# Patient Record
Sex: Female | Born: 1995 | Race: Black or African American | Hispanic: No | Marital: Single | Smoking: Never smoker
Health system: Southern US, Community
[De-identification: ages and names within clinical notes are randomized; demographics above are authoritative.]

## PROBLEM LIST (undated history)

## (undated) DIAGNOSIS — D332 Benign neoplasm of brain, unspecified: Secondary | ICD-10-CM

## (undated) DIAGNOSIS — E282 Polycystic ovarian syndrome: Secondary | ICD-10-CM

## (undated) DIAGNOSIS — R7303 Prediabetes: Secondary | ICD-10-CM

## (undated) HISTORY — DX: Benign neoplasm of brain, unspecified: D33.2

## (undated) HISTORY — DX: Polycystic ovarian syndrome: E28.2

## (undated) HISTORY — PX: BRAIN SURGERY: SHX531

## (undated) HISTORY — DX: Prediabetes: R73.03

---
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
## 2016-01-15 ENCOUNTER — Telehealth: Payer: Self-pay | Admitting: Family Medicine

## 2016-01-15 ENCOUNTER — Ambulatory Visit (INDEPENDENT_AMBULATORY_CARE_PROVIDER_SITE_OTHER): Payer: BC Managed Care – PPO | Admitting: Family Medicine

## 2016-01-15 ENCOUNTER — Encounter: Payer: Self-pay | Admitting: Family Medicine

## 2016-01-15 ENCOUNTER — Telehealth: Payer: Self-pay | Admitting: *Deleted

## 2016-01-15 VITALS — BP 122/62 | HR 75 | Temp 98.2°F | Ht 66.25 in | Wt 216.5 lb

## 2016-01-15 DIAGNOSIS — Z0000 Encounter for general adult medical examination without abnormal findings: Secondary | ICD-10-CM

## 2016-01-15 DIAGNOSIS — Z01419 Encounter for gynecological examination (general) (routine) without abnormal findings: Secondary | ICD-10-CM | POA: Insufficient documentation

## 2016-01-15 LAB — CBC WITH DIFFERENTIAL/PLATELET
BASOS ABS: 0 10*3/uL (ref 0.0–0.1)
Basophils Relative: 0.5 % (ref 0.0–3.0)
EOS ABS: 0.8 10*3/uL — AB (ref 0.0–0.7)
Eosinophils Relative: 11.8 % — ABNORMAL HIGH (ref 0.0–5.0)
HCT: 41.7 % (ref 36.0–49.0)
Hemoglobin: 13.7 g/dL (ref 12.0–16.0)
LYMPHS ABS: 2.1 10*3/uL (ref 0.7–4.0)
Lymphocytes Relative: 30.4 % (ref 24.0–48.0)
MCHC: 32.8 g/dL (ref 31.0–37.0)
MCV: 82.5 fl (ref 78.0–98.0)
MONOS PCT: 7.8 % (ref 3.0–12.0)
Monocytes Absolute: 0.5 10*3/uL (ref 0.1–1.0)
NEUTROS ABS: 3.5 10*3/uL (ref 1.4–7.7)
NEUTROS PCT: 49.5 % (ref 43.0–71.0)
PLATELETS: 296 10*3/uL (ref 150.0–575.0)
RBC: 5.05 Mil/uL (ref 3.80–5.70)
RDW: 13.5 % (ref 11.4–15.5)
WBC: 7 10*3/uL (ref 4.5–13.5)

## 2016-01-15 LAB — LIPID PANEL
CHOL/HDL RATIO: 4
Cholesterol: 206 mg/dL — ABNORMAL HIGH (ref 0–200)
HDL: 49.8 mg/dL (ref >39.00)
LDL CALC: 134 mg/dL — AB (ref 0–99)
NONHDL: 156.15
Triglycerides: 112 mg/dL (ref 0.0–149.0)
VLDL: 22.4 mg/dL (ref 0.0–40.0)

## 2016-01-15 LAB — COMPREHENSIVE METABOLIC PANEL
ALT: 13 U/L (ref 0–35)
AST: 15 U/L (ref 0–37)
Albumin: 4.2 g/dL (ref 3.5–5.2)
Alkaline Phosphatase: 50 U/L (ref 47–119)
BILIRUBIN TOTAL: 0.5 mg/dL (ref 0.2–1.2)
BUN: 9 mg/dL (ref 6–23)
CHLORIDE: 105 meq/L (ref 96–112)
CO2: 27 meq/L (ref 19–32)
Calcium: 9.7 mg/dL (ref 8.4–10.5)
Creatinine, Ser: 0.9 mg/dL (ref 0.40–1.20)
GFR: 103.17 mL/min (ref >60.00)
GLUCOSE: 94 mg/dL (ref 70–99)
Potassium: 4.4 mEq/L (ref 3.5–5.1)
Sodium: 139 mEq/L (ref 135–145)
Total Protein: 7.3 g/dL (ref 6.0–8.3)

## 2016-01-15 LAB — TSH: TSH: 2.55 u[IU]/mL (ref 0.40–5.00)

## 2016-01-15 NOTE — Progress Notes (Signed)
Pre visit review using our clinic review tool, if applicable. No additional management support is needed unless otherwise documented below in the visit note. 

## 2016-01-15 NOTE — Telephone Encounter (Signed)
Pt dropped off immunization form for school.  Place in rx tower. Please call [phone] when ready  Thanks

## 2016-01-15 NOTE — Progress Notes (Signed)
Subjective:   Patient ID: Shawna Robbins, female    DOB: [birth year], 20 y.o.   MRN: [id number]  Shawna Robbins is a pleasant 20 y.o. year old female who presents to clinic today with Annual Exam  on 01/15/2016  HPI: Doing well. Not sexually active.  She is excited about starting at [employer] in the fall.  She plans on going to pharmacy school.  Has no complaints today.   No results found for: CHOL, HDL, LDLCALC, LDLDIRECT, TRIG, CHOLHDL  No results found for: CREATININE  Current Outpatient Prescriptions on File Prior to Visit  Medication Sig Dispense Refill  . albuterol (PROVENTIL HFA;VENTOLIN HFA) 108 (90 Base) MCG/ACT inhaler Inhale 1-2 puffs into the lungs every 6 (six) hours as needed for wheezing or shortness of breath. 1 Inhaler 0  . montelukast (SINGULAIR) 10 MG tablet Take 1 tablet (10 mg total) by mouth at bedtime. 30 tablet 3   No current facility-administered medications on file prior to visit.     No Known Allergies  Past Medical History:  Diagnosis Date  . 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  Family History  Problem Relation Age of Onset  . Hypertension Maternal Grandmother     Social History   Social History  . Marital status: Single    Spouse name: N/A  . Number of children: N/A  . Years of education: N/A   Occupational History  . Not on file.   Social History Main Topics  . Smoking status: Never Smoker  . Smokeless tobacco: Never Used  . Alcohol use No  . Drug use: No  . Sexual activity: Not on file   Other Topics Concern  . 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   The PMH, PSH, Social History, Family History, Medications, and allergies have been reviewed in [facility], and have been updated if relevant.   Review of Systems  Constitutional: Negative.   HENT: Negative.   Eyes: Negative.   Respiratory: Negative.     Cardiovascular: Negative.   Gastrointestinal: Negative.   Endocrine: Negative.   Genitourinary: Negative.   Musculoskeletal: Negative.   Skin: Negative.   Allergic/Immunologic: Negative.   Neurological: Negative.   Hematological: Negative.   Psychiatric/Behavioral: Negative.   All other systems reviewed and are negative.      Objective:    BP 122/62   Pulse 75   Temp 98.2 F (36.8 C) (Oral)   Ht 5' 6.25" (1.683 m)   Wt 216 lb 8 oz (98.2 kg)   SpO2 98%   BMI 34.68 kg/m    Physical Exam  Constitutional: She is oriented to person, place, and time. She appears well-developed and well-nourished. No distress.  HENT:  Head: Normocephalic and atraumatic.  Eyes: Conjunctivae are normal.  Neck: Normal range of motion.  Cardiovascular: Normal rate and regular rhythm.   Pulmonary/Chest: Effort normal and breath sounds normal. No respiratory distress.  Abdominal: Soft.  Musculoskeletal: Normal range of motion. She exhibits no edema.  Neurological: She is alert and oriented to person, place, and time. No cranial nerve deficit.  Skin: Skin is warm and dry.  Psychiatric: She has a normal mood and affect. Her behavior is normal. Judgment and thought content normal.  Nursing note and vitals reviewed.         Assessment & Plan:  Well woman exam - Plan: CBC with Differential/Platelet, Comprehensive metabolic panel, Lipid panel, TSH, Urinalysis - Glucose/Protein No Follow-up on file.

## 2016-01-15 NOTE — Telephone Encounter (Signed)
PT brought in the Physical form to be filled out. Please call her when it is ready. Form placed in prescription tower.

## 2016-01-15 NOTE — Patient Instructions (Signed)
Great to see you! Good luck this fall  We will call you with your results from today and you can view them online.

## 2016-01-15 NOTE — Assessment & Plan Note (Signed)
Reviewed preventive care protocols, scheduled due services, and updated immunizations Discussed nutrition, exercise, diet, and healthy lifestyle.  Orders Placed This Encounter  Procedures  . CBC with Differential/Platelet  . Comprehensive metabolic panel  . Lipid panel  . TSH  . Urinalysis - Glucose/Protein

## 2016-01-16 ENCOUNTER — Encounter: Payer: Self-pay | Admitting: *Deleted

## 2016-01-16 ENCOUNTER — Other Ambulatory Visit: Payer: Self-pay | Admitting: Family Medicine

## 2016-01-16 ENCOUNTER — Ambulatory Visit (INDEPENDENT_AMBULATORY_CARE_PROVIDER_SITE_OTHER): Payer: BC Managed Care – PPO | Admitting: *Deleted

## 2016-01-16 DIAGNOSIS — Z0000 Encounter for general adult medical examination without abnormal findings: Secondary | ICD-10-CM | POA: Diagnosis not present

## 2016-01-16 DIAGNOSIS — Z111 Encounter for screening for respiratory tuberculosis: Secondary | ICD-10-CM

## 2016-01-16 NOTE — Telephone Encounter (Signed)
Form given to [facility] for completion, following reading of PPD

## 2016-01-18 LAB — URINALYSIS, ROUTINE W REFLEX MICROSCOPIC
Bilirubin Urine: NEGATIVE
HGB URINE DIPSTICK: NEGATIVE
Ketones, ur: NEGATIVE
Nitrite: NEGATIVE
Specific Gravity, Urine: 1.02 (ref 1.000–1.030)
TOTAL PROTEIN, URINE-UPE24: NEGATIVE
UROBILINOGEN UA: 0.2 (ref 0.0–1.0)
Urine Glucose: NEGATIVE
pH: 6.5 (ref 5.0–8.0)

## 2016-01-18 LAB — TB SKIN TEST: TB SKIN TEST: NEGATIVE

## 2016-01-18 NOTE — Addendum Note (Signed)
Addended by: Ellamae Sia on: 01/18/2016 12:09 PM   Modules accepted: Orders

## 2016-01-18 NOTE — Telephone Encounter (Signed)
Pt came in for TB skin test read and brought urine specimen;Terry looked in orders and sent to lab. Pt will call back on Mon to verify form is ready for pick up. Forms given to [place].

## 2016-01-23 NOTE — Telephone Encounter (Signed)
Patient called to find out if her form is ready for pick up.  Please call her back at [phone].

## 2016-01-24 NOTE — Telephone Encounter (Signed)
Lm on pts vm and informed her paperwork is available for pickup at the front desk

## 2016-01-28 ENCOUNTER — Other Ambulatory Visit: Payer: Self-pay | Admitting: Family Medicine

## 2016-07-14 DIAGNOSIS — E221 Hyperprolactinemia: Secondary | ICD-10-CM | POA: Insufficient documentation

## 2016-07-15 ENCOUNTER — Other Ambulatory Visit: Payer: Self-pay | Admitting: Obstetrics and Gynecology

## 2016-07-15 DIAGNOSIS — E221 Hyperprolactinemia: Secondary | ICD-10-CM

## 2016-07-28 ENCOUNTER — Ambulatory Visit
Admission: RE | Admit: 2016-07-28 | Discharge: 2016-07-28 | Disposition: A | Discharge status: Home / Self Care | Payer: BC Managed Care – PPO | Source: Ambulatory Visit | Attending: Obstetrics and Gynecology | Admitting: Obstetrics and Gynecology

## 2016-07-28 DIAGNOSIS — E221 Hyperprolactinemia: Secondary | ICD-10-CM

## 2016-07-28 MED ORDER — GADOBENATE DIMEGLUMINE 529 MG/ML IV SOLN
10.0000 mL | Freq: Once | INTRAVENOUS | Status: AC | PRN
  Administered 2016-07-28: 10 mL via INTRAVENOUS

## 2016-08-11 DIAGNOSIS — E282 Polycystic ovarian syndrome: Secondary | ICD-10-CM | POA: Insufficient documentation

## 2016-08-11 DIAGNOSIS — D352 Benign neoplasm of pituitary gland: Secondary | ICD-10-CM | POA: Insufficient documentation

## 2016-08-17 DIAGNOSIS — N911 Secondary amenorrhea: Secondary | ICD-10-CM | POA: Insufficient documentation

## 2016-09-15 ENCOUNTER — Ambulatory Visit (INDEPENDENT_AMBULATORY_CARE_PROVIDER_SITE_OTHER): Payer: BC Managed Care – PPO | Admitting: Family Medicine

## 2016-09-15 ENCOUNTER — Encounter (INDEPENDENT_AMBULATORY_CARE_PROVIDER_SITE_OTHER): Payer: Self-pay

## 2016-09-15 DIAGNOSIS — E237 Disorder of pituitary gland, unspecified: Secondary | ICD-10-CM | POA: Diagnosis not present

## 2016-09-15 NOTE — Progress Notes (Signed)
Subjective:   Patient ID: Shawna Robbins, female    DOB: 08-21-1995, 21 y.o.   MRN: [id number]  Shawna Robbins is a pleasant 21 y.o. year old female who presents to clinic today with Follow-up (Endocrinologist)  on 09/15/2016  HPI:  Here to discuss what has been happening with her- she was diagnosed with a pituitary tumor in 07/2016.  Was seeing her gynecologist for amenorrhea and discovered that her prolactin level was elevated.  Since she has a history of a benign brain tumor resected in 2011, she was referred again to neurosurgery at [facility].  Notes in Epic reviewed  Neurosurgery updated the following plan of care on 08/26/16:   1. We defer to Endocrinology for medication treatment of pituitary lesion (who has ordered Cortisol testing to evaluate pituitary and tested prolactin which was abnormal) 2. We are adding patient to tumor board to discuss treatment plan (this message was sent to scheduling, (this has not been scheduled yet) 3. We would like 6 month follow up with MRI and same day appt with Dr Tivis Ringer 4. Recommend opthalmology evaluation with field cut exam (If she needs a referral to opthalmology we can provide this )  Now she has done cheek swabs and 24 hour urine tests for endo and awaiting the next step- endocrinologist is Dr. Ladonna Snide at [facility].  Current Outpatient Prescriptions on File Prior to Visit  Medication Sig Dispense Refill  . montelukast (SINGULAIR) 10 MG tablet Take 1 tablet (10 mg total) by mouth at bedtime. 30 tablet 3  . PROAIR HFA 108 (90 Base) MCG/ACT inhaler INHALE 1-2 PUFFS INTO THE LUNGS EVERY 6 (SIX) HOURS AS NEEDED FOR WHEEZING OR SHORTNESS OF BREATH. 8.5 Inhaler 0   No current facility-administered medications on file prior to visit.     No Known Allergies  Past Medical History:  Diagnosis Date  . 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  Family History  Problem Relation Age of Onset  .  Hypertension Maternal Grandmother     Social History   Social History  . Marital status: Single    Spouse name: N/A  . Number of children: N/A  . Years of education: N/A   Occupational History  . Not on file.   Social History Main Topics  . Smoking status: Never Smoker  . Smokeless tobacco: Never Used  . Alcohol use No  . Drug use: No  . Sexual activity: Not on file   Other Topics Concern  . Not on file   Social History Narrative   Transferring to [employer] this fall- 21   The PMH, PSH, Social History, Family History, Medications, and allergies have been reviewed in [facility], and have been updated if relevant.   Review of Systems  Constitutional: Positive for unexpected weight change. Negative for fatigue.  Eyes: Negative.   Neurological: Negative.   All other systems reviewed and are negative.      Objective:    BP 118/70   Pulse 98   Wt 231 lb 8 oz (105 kg)   SpO2 99%   BMI 37.08 kg/m    Physical Exam  Constitutional: She is oriented to person, place, and time. She appears well-developed and well-nourished. No distress.  HENT:  Head: Normocephalic and atraumatic.  Eyes: Conjunctivae are normal.  Cardiovascular: Normal rate.   Pulmonary/Chest: Effort normal.  Musculoskeletal: Normal range of motion. She exhibits no edema.  Neurological:  She is alert and oriented to person, place, and time. No cranial nerve deficit.  Skin: Skin is warm and dry. She is not diaphoretic.  Psychiatric: She has a normal mood and affect. Her behavior is normal. Judgment and thought content normal.  Nursing note and vitals reviewed.         Assessment & Plan:   Lesion of pituitary gland (HCC) No Follow-up on file.

## 2016-09-15 NOTE — Assessment & Plan Note (Signed)
>  25 minutes spent in face to face time with patient, >50% spent in counselling or coordination of care I will request more records and labs from Dr. Ladonna Snide.

## 2016-11-27 DIAGNOSIS — L700 Acne vulgaris: Secondary | ICD-10-CM | POA: Insufficient documentation

## 2017-03-15 ENCOUNTER — Emergency Department (HOSPITAL_COMMUNITY)
Admission: EM | Admit: 2017-03-15 | Discharge: 2017-03-15 | Disposition: A | Discharge status: Home / Self Care | Payer: BC Managed Care – PPO | Attending: Emergency Medicine | Admitting: Emergency Medicine

## 2017-03-15 ENCOUNTER — Encounter (HOSPITAL_COMMUNITY): Payer: Self-pay | Admitting: Emergency Medicine

## 2017-03-15 ENCOUNTER — Emergency Department (HOSPITAL_COMMUNITY): Payer: BC Managed Care – PPO

## 2017-03-15 DIAGNOSIS — N200 Calculus of kidney: Secondary | ICD-10-CM | POA: Insufficient documentation

## 2017-03-15 DIAGNOSIS — Z79899 Other long term (current) drug therapy: Secondary | ICD-10-CM | POA: Diagnosis not present

## 2017-03-15 DIAGNOSIS — R1031 Right lower quadrant pain: Secondary | ICD-10-CM | POA: Diagnosis present

## 2017-03-15 LAB — COMPREHENSIVE METABOLIC PANEL
ALBUMIN: 3.9 g/dL (ref 3.5–5.0)
ALT: 36 U/L (ref 14–54)
AST: 54 U/L — AB (ref 15–41)
Alkaline Phosphatase: 50 U/L (ref 38–126)
Anion gap: 10 (ref 5–15)
CHLORIDE: 105 mmol/L (ref 101–111)
CO2: 23 mmol/L (ref 22–32)
CREATININE: 1.18 mg/dL — AB (ref 0.44–1.00)
Calcium: 9.2 mg/dL (ref 8.9–10.3)
GFR calc Af Amer: >60 mL/min (ref >60)
GLUCOSE: 113 mg/dL — AB (ref 65–99)
Potassium: 3.6 mmol/L (ref 3.5–5.1)
SODIUM: 138 mmol/L (ref 135–145)
Total Bilirubin: 0.7 mg/dL (ref 0.3–1.2)
Total Protein: 7.1 g/dL (ref 6.5–8.1)

## 2017-03-15 LAB — URINALYSIS, ROUTINE W REFLEX MICROSCOPIC
BILIRUBIN URINE: NEGATIVE
GLUCOSE, UA: NEGATIVE mg/dL
Ketones, ur: NEGATIVE mg/dL
Nitrite: NEGATIVE
PH: 6 (ref 5.0–8.0)
Protein, ur: NEGATIVE mg/dL
SPECIFIC GRAVITY, URINE: 1.021 (ref 1.005–1.030)

## 2017-03-15 LAB — CBC
HCT: 41.6 % (ref 36.0–46.0)
Hemoglobin: 13.1 g/dL (ref 12.0–15.0)
MCH: 27.1 pg (ref 26.0–34.0)
MCHC: 31.5 g/dL (ref 30.0–36.0)
MCV: 86 fL (ref 78.0–100.0)
PLATELETS: 285 10*3/uL (ref 150–400)
RBC: 4.84 MIL/uL (ref 3.87–5.11)
RDW: 13.8 % (ref 11.5–15.5)
WBC: 6.8 10*3/uL (ref 4.0–10.5)

## 2017-03-15 LAB — I-STAT BETA HCG BLOOD, ED (MC, WL, AP ONLY): I-stat hCG, quantitative: <5 m[IU]/mL (ref <5)

## 2017-03-15 LAB — LIPASE, BLOOD: LIPASE: 29 U/L (ref 11–51)

## 2017-03-15 MED ORDER — ONDANSETRON HCL 4 MG/2ML IJ SOLN
4.0000 mg | Freq: Once | INTRAMUSCULAR | Status: AC
Start: 2017-03-15 — End: 2017-03-15
  Administered 2017-03-15: 4 mg via INTRAVENOUS
  Filled 2017-03-15: qty 2

## 2017-03-15 MED ORDER — ONDANSETRON HCL 4 MG PO TABS: 4.0000 mg | ORAL_TABLET | Freq: Four times a day (QID) | ORAL | 0 refills | Status: DC

## 2017-03-15 MED ORDER — HYDROCODONE-ACETAMINOPHEN 5-325 MG PO TABS: 1.0000 | ORAL_TABLET | ORAL | 0 refills | Status: DC | PRN

## 2017-03-15 MED ORDER — TAMSULOSIN HCL 0.4 MG PO CAPS: 0.4000 mg | ORAL_CAPSULE | Freq: Every day | ORAL | 0 refills | Status: DC

## 2017-03-15 MED ORDER — KETOROLAC TROMETHAMINE 30 MG/ML IJ SOLN
30.0000 mg | Freq: Once | INTRAMUSCULAR | Status: AC
Start: 2017-03-15 — End: 2017-03-15
  Administered 2017-03-15: 30 mg via INTRAVENOUS
  Filled 2017-03-15: qty 1

## 2017-03-15 MED ORDER — HYDROMORPHONE HCL 1 MG/ML IJ SOLN
1.0000 mg | Freq: Once | INTRAMUSCULAR | Status: AC
  Administered 2017-03-15: 1 mg via INTRAVENOUS
  Filled 2017-03-15: qty 1

## 2017-03-15 MED ORDER — SODIUM CHLORIDE 0.9 % IV BOLUS (SEPSIS)
1000.0000 mL | Freq: Once | INTRAVENOUS | Status: AC
  Administered 2017-03-15: 1000 mL via INTRAVENOUS

## 2017-03-15 NOTE — ED Triage Notes (Signed)
Pt c/o right flank pain that began at midnight tonight. C/o nausea, denies vomiting/diarrhea. Denies urinary symptoms/vaginal discharge.

## 2017-03-15 NOTE — ED Provider Notes (Signed)
Pt reports she is pain free.  Pt is able to tolerate fluids without vomiting Meds ordered this encounter  Medications  . cabergoline (DOSTINEX) 0.5 MG tablet    Sig: Take 0.5 mg by mouth 2 (two) times a week.  . sodium chloride 0.9 % bolus 1,000 mL  . ketorolac (TORADOL) 30 MG/ML injection 30 mg  . HYDROmorphone (DILAUDID) injection 1 mg  . ondansetron (ZOFRAN) injection 4 mg  . ondansetron (ZOFRAN) 4 MG tablet    Sig: Take 1 tablet (4 mg total) by mouth every 6 (six) hours.    Dispense:  12 tablet    Refill:  0    Order Specific Question:   Supervising Provider    Answer:   MILLER, BRIAN [[id number]]  . HYDROcodone-acetaminophen (NORCO/VICODIN) 5-325 MG tablet    Sig: Take 1-2 tablets by mouth every 4 (four) hours as needed.    Dispense:  8 tablet    Refill:  0    Order Specific Question:   Supervising Provider    Answer:   MILLER, BRIAN [[id number]]  . tamsulosin (FLOMAX) 0.4 MG CAPS capsule    Sig: Take 1 capsule (0.4 mg total) by mouth daily.    Dispense:  30 capsule    Refill:  0    Order Specific Question:   Supervising Provider    Answer:   MILLER, BRIAN [[id number]]   .  An After Visit Summary was printed and given to the patient.    Fransico Meadow, PA-C 03/15/17 0930    Noemi Chapel, MD 03/17/17 2020

## 2017-03-15 NOTE — Discharge Instructions (Addendum)
You can take Tylenol or Ibuprofen as directed for pain. You can alternate Tylenol and Ibuprofen every 4 hours. You can take pain medication for severe or break through pain.   Make sure you are staying hydrated and drinking plenty of fluids.   Use the strainer to strain your urine.  Take Flomax as directed.   Follow-up with referred Urology for any worsening or concerning symptoms.   Return to the ED for any worsening pain, fever, persistent vomiting, pain with urination or any other worsening or concerning symptoms.

## 2017-03-15 NOTE — ED Provider Notes (Signed)
[facility] DEPT Provider Note   CSN: [id number] Arrival date & time: 03/15/17  0231     History   Chief Complaint Chief Complaint  Patient presents with  . Flank Pain    HPI Shawna Robbins is a 21 y.o. female who presents with right side pain that began at approximately 12 AM. Patient stats that pain has been constant since onset. She states that the pain originates to the right side and radiates to the RLQ. She denies any alleviating or admitting factors. She states that pain is not affected by movement or laying supine.She states that she has not taken any medications for symptoms. Patient reports associated nausea but denies any vomiting. Patient states that she was in her normal state of health yesterday prior to onset of symptoms.  The history is provided by the patient.    Past Medical History:  Diagnosis Date  . Brain tumor (benign) ([place]) 2011   s/p resection at [facility]    Patient Active Problem List   Diagnosis Date Noted  . 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 Abnormal menstrual periods 05/24/2015    Past Surgical History:  Procedure Laterality Date  . BRAIN SURGERY      OB History    No data available       Home Medications    Prior to Admission medications   Medication Sig Start Date End Date Taking? Authorizing Provider  cabergoline (DOSTINEX) 0.5 MG tablet Take 0.5 mg by mouth 2 (two) times a week. 03/09/17  Yes [provider]  HYDROcodone-acetaminophen (NORCO/VICODIN) 5-325 MG tablet Take 1-2 tablets by mouth every 4 (four) hours as needed. 03/15/17   Volanda Napoleon, PA-C  montelukast (SINGULAIR) 10 MG tablet Take 1 tablet (10 mg total) by mouth at bedtime. Patient not taking: Reported on 03/15/2017 12/05/15   Lucille Passy, MD  ondansetron (ZOFRAN) 4 MG tablet Take 1 tablet (4 mg total) by mouth every 6 (six) hours. 03/15/17   Volanda Napoleon, PA-C  PROAIR HFA 108 (90 Base) MCG/ACT inhaler INHALE 1-2 PUFFS INTO THE LUNGS EVERY 6  (SIX) HOURS AS NEEDED FOR WHEEZING OR SHORTNESS OF BREATH. Patient not taking: Reported on 03/15/2017 01/29/16   Lucille Passy, MD  tamsulosin (FLOMAX) 0.4 MG CAPS capsule Take 1 capsule (0.4 mg total) by mouth daily. 03/15/17   Volanda Napoleon, PA-C    Family History Family History  Problem Relation Age of Onset  . Hypertension Maternal Grandmother     Social History Social History  Substance Use Topics  . Smoking status: Never Smoker  . Smokeless tobacco: Never Used  . Alcohol use No     Allergies   Patient has no known allergies.   Review of Systems Review of Systems  Constitutional: Negative for chills and fever.  Respiratory: Negative for shortness of breath.   Cardiovascular: Negative for chest pain.  Gastrointestinal: Positive for abdominal pain and nausea. Negative for blood in stool, diarrhea and vomiting.  Genitourinary: Positive for flank pain. Negative for dysuria, hematuria and vaginal bleeding.  Neurological: Negative for weakness and numbness.     Physical Exam Updated Vital Signs BP (!) 94/51 (BP Location: Left Arm)   Pulse 79   Temp 98.2 F (36.8 C) (Oral)   Resp 16   Ht 5\' 7"  (1.702 m)   Wt 106.1 kg (234 lb)   SpO2 99%   BMI 36.65 kg/m   Physical Exam  Constitutional: She is oriented to person, place, and  time. She appears well-developed and well-nourished.  Appears uncomfortable but no acute distress   HENT:  Head: Normocephalic and atraumatic.  Mouth/Throat: Oropharynx is clear and moist and mucous membranes are normal.  Eyes: Pupils are equal, round, and reactive to light. Conjunctivae, EOM and lids are normal.  Neck: Full passive range of motion without pain.  Cardiovascular: Normal rate, regular rhythm, normal heart sounds and normal pulses.  Exam reveals no gallop and no friction rub.   No murmur heard. Pulmonary/Chest: Effort normal and breath sounds normal.  Abdominal: Soft. Normal appearance. There is tenderness in the right lower  quadrant. There is no rigidity, no guarding and no CVA tenderness.  Abdomen is soft, non-distended. She has tenderness to palpation that begins at the lateral right side of the abdomen and extends down into the RLQ.   Musculoskeletal: Normal range of motion.  Neurological: She is alert and oriented to person, place, and time.  Skin: Skin is warm and dry. Capillary refill takes less than 2 seconds.  Psychiatric: She has a normal mood and affect. Her speech is normal.  Nursing note and vitals reviewed.    ED Treatments / Results  Labs (all labs ordered are listed, but only abnormal results are displayed) Labs Reviewed  COMPREHENSIVE METABOLIC PANEL - Abnormal; Notable for the following:       Result Value   Glucose, Bld 113 (*)    BUN <5 (*)    Creatinine, Ser 1.18 (*)    AST 54 (*)    All other components within normal limits  URINALYSIS, ROUTINE W REFLEX MICROSCOPIC - Abnormal; Notable for the following:    APPearance HAZY (*)    Hgb urine dipstick LARGE (*)    Leukocytes, UA TRACE (*)    Bacteria, UA RARE (*)    Squamous Epithelial / LPF 0-5 (*)    All other components within normal limits  LIPASE, BLOOD  CBC  I-STAT BETA HCG BLOOD, ED ([facility], [facility], [facility] ONLY)    EKG  EKG Interpretation None       Radiology Ct Renal Stone Study  Result Date: 03/15/2017 CLINICAL DATA:  Sudden onset right flank pain starting about midnight. EXAM: CT ABDOMEN AND PELVIS WITHOUT CONTRAST TECHNIQUE: Multidetector CT imaging of the abdomen and pelvis was performed following the standard protocol without IV contrast. COMPARISON:  None. FINDINGS: Lower chest: Mild dependent changes in the lung bases. Hepatobiliary: No focal liver abnormality is seen. No gallstones, gallbladder wall thickening, or biliary dilatation. Pancreas: Unremarkable. No pancreatic ductal dilatation or surrounding inflammatory changes. Spleen: Normal in size without focal abnormality. Adrenals/Urinary Tract: There is a punctate size  stone measuring 2 mm in the distal right ureter at the ureterovesical junction. There is mild proximal hydronephrosis and hydroureter with stranding around the right kidney. No additional stones identified. No hydronephrosis or hydroureter on the left. Bladder is decompressed. Stomach/Bowel: Stomach is within normal limits. Appendix appears normal. No evidence of bowel wall thickening, distention, or inflammatory changes. Vascular/Lymphatic: No significant vascular findings are present. No enlarged abdominal or pelvic lymph nodes. Reproductive: Uterus and bilateral adnexa are unremarkable. Other: No abdominal wall hernia or abnormality. No abdominopelvic ascites. Musculoskeletal: No acute or significant osseous findings. IMPRESSION: 2 mm stone in the distal right ureter with moderate proximal obstruction. Electronically Signed   By: Lucienne Capers M.D.   On: 03/15/2017 06:52    Procedures Procedures (including critical care time)  Medications Ordered in ED Medications  sodium chloride 0.9 % bolus 1,000 mL (0  mLs Intravenous Stopped 03/15/17 0737)  ketorolac (TORADOL) 30 MG/ML injection 30 mg (30 mg Intravenous Given 03/15/17 0714)  HYDROmorphone (DILAUDID) injection 1 mg (1 mg Intravenous Given 03/15/17 0730)  ondansetron (ZOFRAN) injection 4 mg (4 mg Intravenous Given 03/15/17 0731)     Initial Impression / Assessment and Plan / ED Course  I have reviewed the triage vital signs and the nursing notes.  Pertinent labs & imaging results that were available during my care of the patient were reviewed by me and considered in my medical decision making (see chart for details).     21 y.o. F who presents with right lateral abdomen pain that began last night at 12 am. Associated with nausea. No vomiting, fevers, Patient is afebrile, non-toxic appearing, sitting comfortably on examination table. Vital signs reviewed and stable. Physical exam shows tenderness to the right lateral abdomen that extends into  the right lower quadrant. Consider kidney stone vs UTI appendicitis. History/physical exam are not concerning for ovarian torsion. Initial labs ordered at triage including beta hCG, lipase, CBC, UA, CMP. IVF given for fluid resuscitation. Analgesics provided in the department.  Labs reviewed. Beta-hCG is negative. Lipase unremarkable. CBC unremarkable. CMP shows slight bump in creatinine at 1.18. Urine shows large hemoglobin, trace leukocytes. Discussed results with dad and patient. Patient states that she is not currently on her menstrual cycle. Concern for kidney stone. Will obtain CT renal stone scan for further evaluation.  CT renal stone study reviewed. There is a 2 mm stone in the distal right ureter with moderate proximal obstruction. Appendix appears normal.  Discussed results with patient and dad. Patient reports slight improvement in pain after medication. Patient is still having some tenderness in the right lateral abdomen and right side. Patient reporting some nausea. Plan for additional pain medications and anti-emetics.  Patient signed out to Alyse Low, PA-C with additional pain meds pending. If patient has improvement in pain and able to tolerate PO, plan for discharge home.    Final Clinical Impressions(s) / ED Diagnoses   Final diagnoses:  Kidney stone    New Prescriptions Discharge Medication List as of 03/15/2017  9:27 AM    START taking these medications   Details  HYDROcodone-acetaminophen (NORCO/VICODIN) 5-325 MG tablet Take 1-2 tablets by mouth every 4 (four) hours as needed., Starting Sun 03/15/2017, Print    ondansetron (ZOFRAN) 4 MG tablet Take 1 tablet (4 mg total) by mouth every 6 (six) hours., Starting Sun 03/15/2017, Print    tamsulosin (FLOMAX) 0.4 MG CAPS capsule Take 1 capsule (0.4 mg total) by mouth daily., Starting Sun 03/15/2017, Print         Volanda Napoleon, PA-C 32/99/24 2683    Delora Fuel, MD 41/96/22 [phone]

## 2018-11-04 ENCOUNTER — Telehealth: Payer: Self-pay | Admitting: Family Medicine

## 2018-11-04 NOTE — Telephone Encounter (Signed)
Called to see if pt still seeing Dr Deborra Medina as PCP since its been since 2018 when she was last seen, left message

## 2019-01-13 ENCOUNTER — Other Ambulatory Visit: Payer: Self-pay

## 2019-01-13 ENCOUNTER — Ambulatory Visit (INDEPENDENT_AMBULATORY_CARE_PROVIDER_SITE_OTHER): Payer: BC Managed Care – PPO | Admitting: Nurse Practitioner

## 2019-01-13 ENCOUNTER — Encounter: Payer: Self-pay | Admitting: Nurse Practitioner

## 2019-01-13 VITALS — Temp 96.7°F | Ht 67.0 in | Wt 240.0 lb

## 2019-01-13 DIAGNOSIS — J069 Acute upper respiratory infection, unspecified: Secondary | ICD-10-CM

## 2019-01-13 MED ORDER — SALINE SPRAY 0.65 % NA SOLN: 1.0000 | NASAL | 0 refills | Status: DC | PRN

## 2019-01-13 MED ORDER — BENZONATATE 100 MG PO CAPS: 100.0000 mg | ORAL_CAPSULE | Freq: Three times a day (TID) | ORAL | 0 refills | Status: DC | PRN

## 2019-01-13 MED ORDER — GUAIFENESIN ER 600 MG PO TB12: 600.0000 mg | ORAL_TABLET | Freq: Two times a day (BID) | ORAL | 0 refills | Status: DC | PRN

## 2019-01-13 MED ORDER — CHLORPHEN-PE-ACETAMINOPHEN 4-10-325 MG PO TABS: 1.0000 | ORAL_TABLET | Freq: Three times a day (TID) | ORAL | 0 refills | Status: AC

## 2019-01-13 NOTE — Progress Notes (Signed)
Virtual Visit via Video Note  I connected with Shawna Robbins on 01/13/19 at  2:00 PM EDT by a video enabled telemedicine application and verified that I am speaking with the correct person using two identifiers.  Location: Patient: Home Provider: Office   I discussed the limitations of evaluation and management by telemedicine and the availability of in person appointments. The patient expressed understanding and agreed to proceed.  CC: pt is c/o chest congestion,coughing at times,headache,scratchy throat/4 days/took mucinex and alka seltzer  History of Present Illness: URI  This is a new problem. The current episode started in the past 7 days. The problem has been gradually worsening. The maximum temperature recorded prior to her arrival was 100.4 - 100.9 F. Associated symptoms include congestion, coughing, headaches, joint pain, rhinorrhea, sinus pain, sneezing and a sore throat. Pertinent negatives include no chest pain, diarrhea, dysuria, ear pain, joint swelling, nausea, neck pain, plugged ear sensation, rash, swollen glands or wheezing. She has tried decongestant and acetaminophen for the symptoms. The treatment provided mild relief.  fever of 100 last night, no fever today  contact with sick niece (nasal congestion and cough) on Saturday, onset of symptoms the next day. Niece's symptoms have now resolve. Her Brother traveled to [place] 2weeks ago, he is asymptomatic. Mother ordered Home test kit from [facility] and she has kit at home now. She plans to collect and return sample today.  Observations/Objective: Physical Exam  Constitutional: She is oriented to person, place, and time. No distress.  Eyes: Pupils are equal, round, and reactive to light. EOM are normal.  Pulmonary/Chest: Effort normal.  Neurological: She is alert and oriented to person, place, and time.  Voice indicates nasal congestion  Skin: She is not diaphoretic.  Psychiatric: She has a normal mood and affect. Her  behavior is normal. Thought content normal.  Vitals reviewed.  Assessment and Plan: Shawna Robbins was seen today for nasal congestion.  Diagnoses and all orders for this visit:  Upper respiratory tract infection, unspecified type -     guaiFENesin (MUCINEX) 600 MG 12 hr tablet; Take 1 tablet (600 mg total) by mouth 2 (two) times daily as needed for cough or to loosen phlegm. -     Chlorphen-PE-Acetaminophen 4-10-325 MG TABS; Take 1 tablet by mouth every 8 (eight) hours for 3 days. -     benzonatate (TESSALON) 100 MG capsule; Take 1 capsule (100 mg total) by mouth 3 (three) times daily as needed for cough. -     sodium chloride (OCEAN) 0.65 % SOLN nasal spray; Place 1 spray into both nostrils as needed for congestion.   Follow Up Instructions: See avs   I discussed the assessment and treatment plan with the patient. The patient was provided an opportunity to ask questions and all were answered. The patient agreed with the plan and demonstrated an understanding of the instructions.   The patient was advised to call back or seek an in-person evaluation if the symptoms worsen or if the condition fails to improve as anticipated.   Wilfred Lacy, NP

## 2019-01-13 NOTE — Patient Instructions (Addendum)
She opted to use Home test kit with self swab. Submit Home test kit to [facility] through [employer] as directed, once nasal swab is collected. Call office with results or send through mychart.  Start zyrtec/claritin/allegra 1tab daily after completion of norel AD tabs. Use saline sinus rinse once a day. May also use humidifier at bedtime as needed. Avoid use oof nasal decongestant for more than 3days.  Follow home isolation guidelines till you get COVID results.  Maintain adequate oral hydration.  This information is directly available on the CDC website: RunningShows.co.za.html    Source:CDC Reference to specific commercial products, manufacturers, companies, or trademarks does not constitute its endorsement or recommendation by the [place], [place], or Centers for [employer] and Prevention.

## 2019-01-29 IMAGING — CT CT RENAL STONE PROTOCOL
2 of 4 series · 15 of 46 positions shown, 17 images · non-contrast
Comparison: None.

CLINICAL DATA: Sudden onset right flank pain starting about
midnight.

EXAM:
CT ABDOMEN AND PELVIS WITHOUT CONTRAST
TECHNIQUE: Multidetector CT imaging of the abdomen and pelvis was performed
following the standard protocol without IV contrast.

[Series 3: ap without · axial · non-contrast · 0.67mm/px · z∈[-981,-531]mm · 12 of 102 slices shown, 14 images]
[im 6/102  soft-tissue]
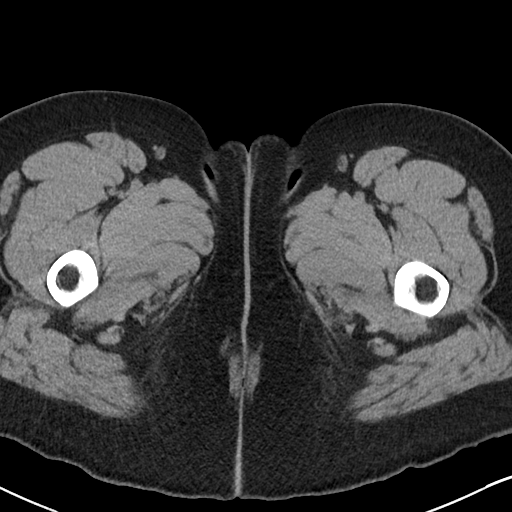
[im 6/102  bone]
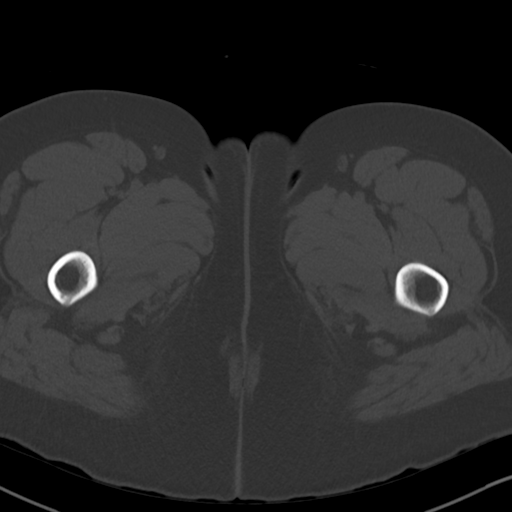
[im 16/102  soft-tissue]
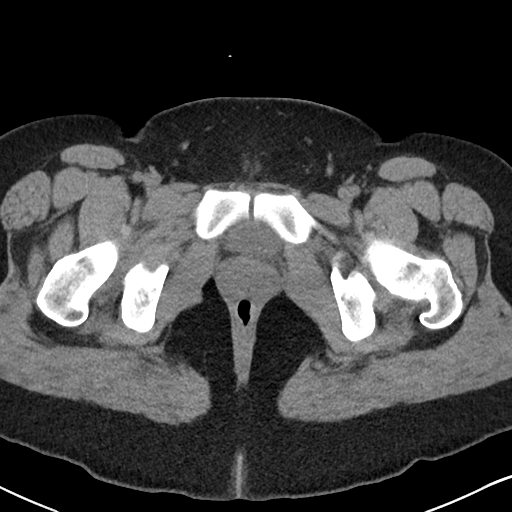
[im 22/102  soft-tissue]
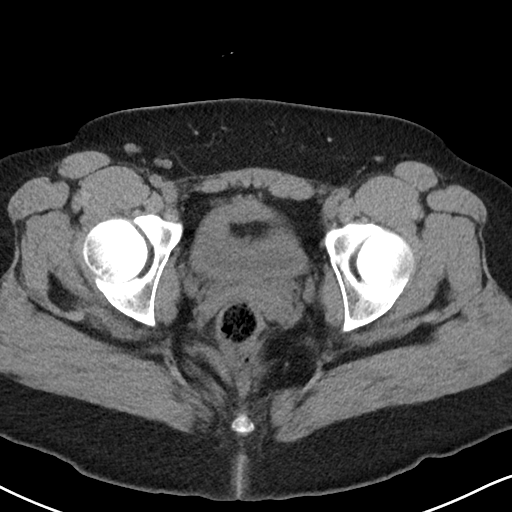
[im 32/102  soft-tissue]
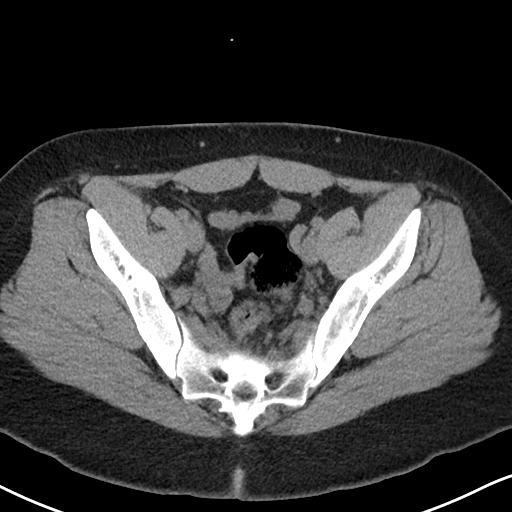
[im 38/102  soft-tissue]
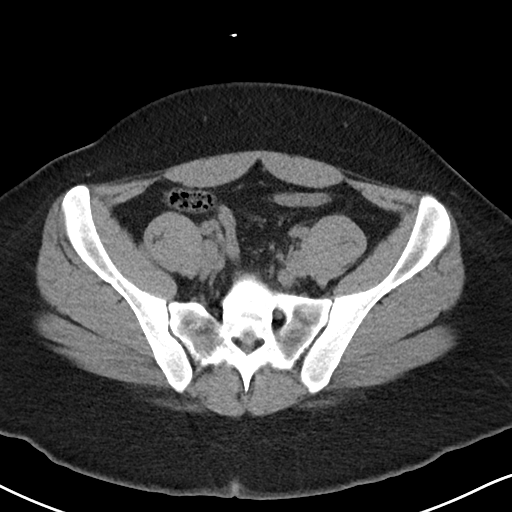
[im 48/102  soft-tissue]
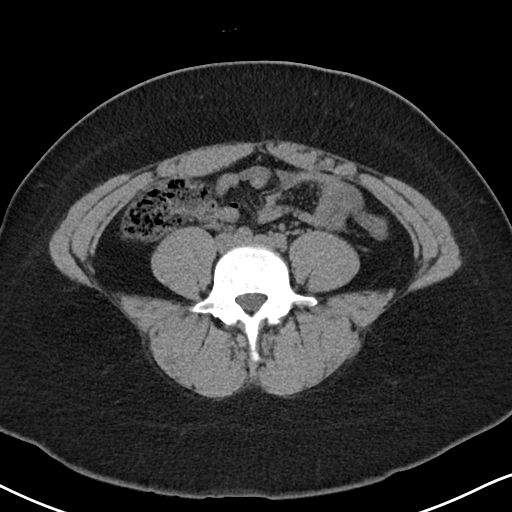
[im 54/102  soft-tissue]
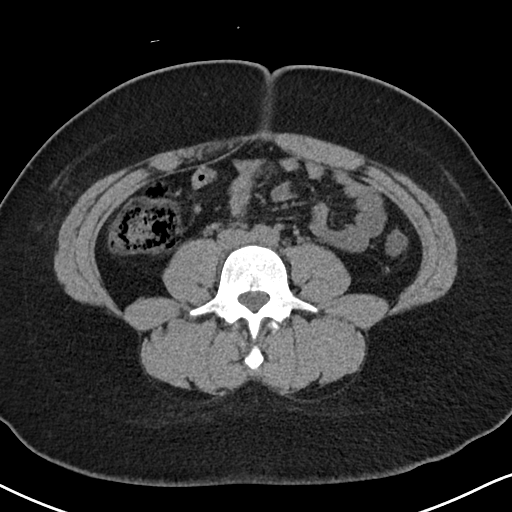
[im 64/102  soft-tissue]
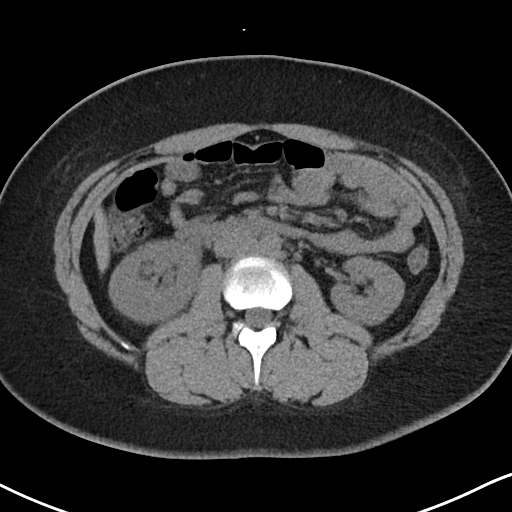
[im 70/102  soft-tissue]
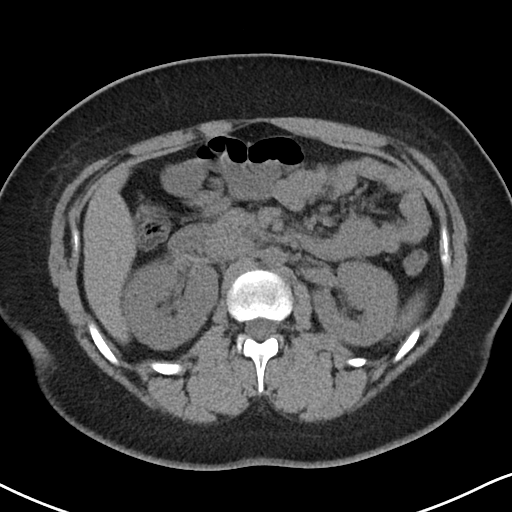
[im 70/102  bone]
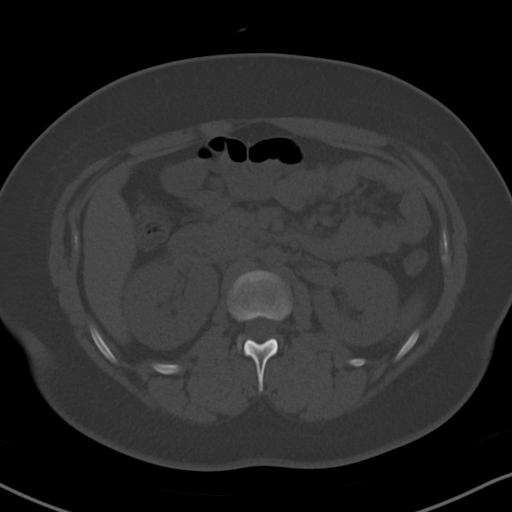
[im 80/102  soft-tissue]
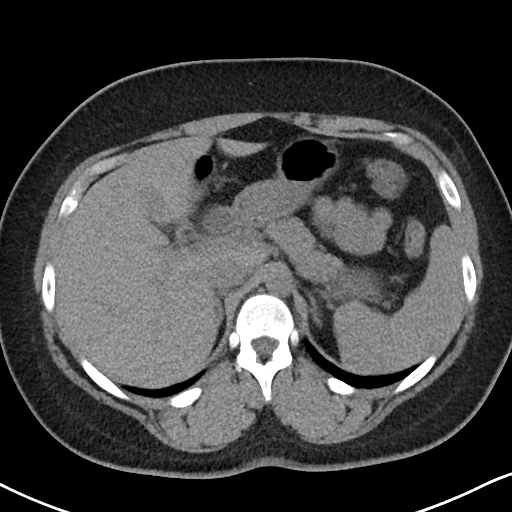
[im 86/102  soft-tissue]
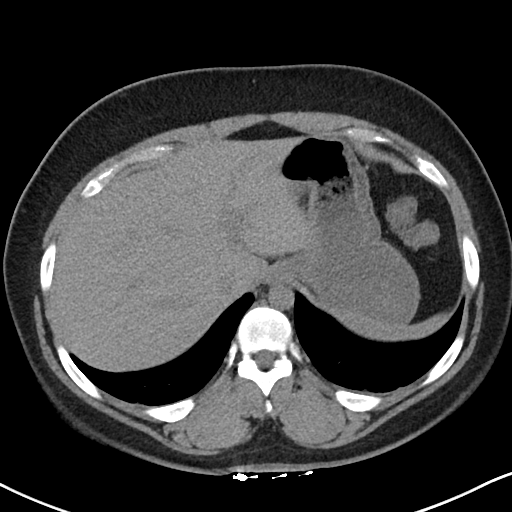
[im 96/102  soft-tissue]
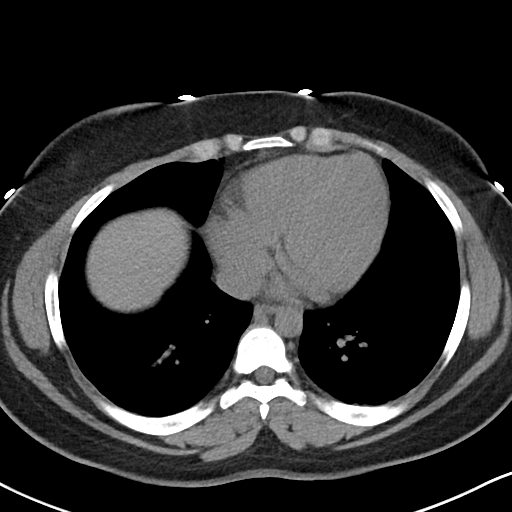

[Series 6: cor · coronal · 0.62mm/px · 3 of 79 slices shown]
[im 27/79  soft-tissue]
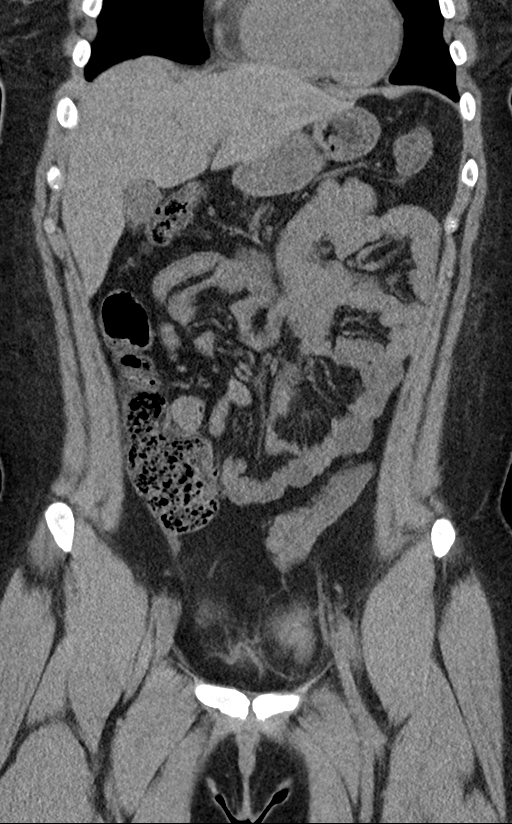
[im 35/79  soft-tissue]
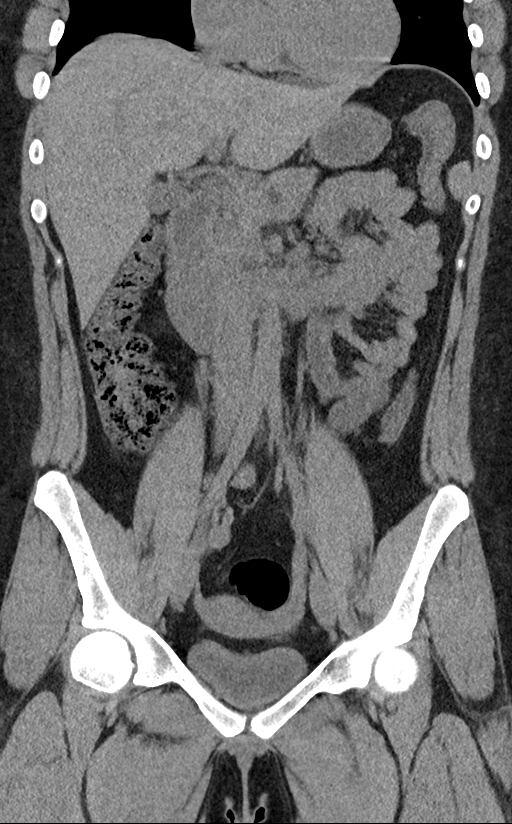
[im 44/79  soft-tissue]
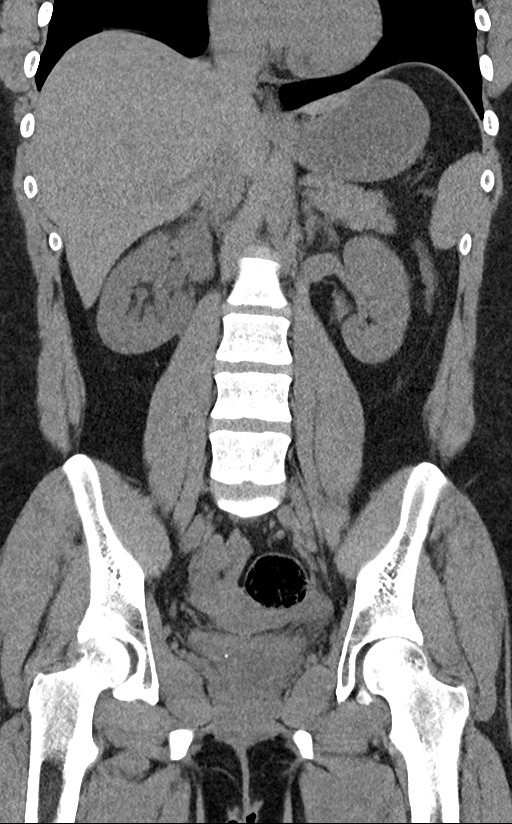

[15 of 46 positions shown; findings below may reference images not displayed]

FINDINGS: Lower chest: Mild dependent changes in the lung bases.

Hepatobiliary: No focal liver abnormality is seen. No gallstones,
gallbladder wall thickening, or biliary dilatation.

Pancreas: Unremarkable. No pancreatic ductal dilatation or
surrounding inflammatory changes.

Spleen: Normal in size without focal abnormality.

Adrenals/Urinary Tract: There is a punctate size stone measuring 2
mm in the distal right ureter at the ureterovesical junction. There
is mild proximal hydronephrosis and hydroureter with stranding
around the right kidney. No additional stones identified. No
hydronephrosis or hydroureter on the left. Bladder is decompressed.

Stomach/Bowel: Stomach is within normal limits. Appendix appears
normal. No evidence of bowel wall thickening, distention, or
inflammatory changes.

Vascular/Lymphatic: No significant vascular findings are present. No
enlarged abdominal or pelvic lymph nodes.

Reproductive: Uterus and bilateral adnexa are unremarkable.

Other: No abdominal wall hernia or abnormality. No abdominopelvic
ascites.

Musculoskeletal: No acute or significant osseous findings.
IMPRESSION: 2 mm stone in the distal right ureter with moderate proximal
obstruction.

## 2019-05-21 LAB — HM PAP SMEAR: HM Pap smear: NEGATIVE

## 2020-01-30 ENCOUNTER — Encounter: Payer: Self-pay | Admitting: Skilled Nursing Facility1

## 2020-01-30 ENCOUNTER — Encounter: Payer: BC Managed Care – PPO | Attending: Internal Medicine | Admitting: Skilled Nursing Facility1

## 2020-01-30 ENCOUNTER — Other Ambulatory Visit: Payer: Self-pay

## 2020-01-30 DIAGNOSIS — E669 Obesity, unspecified: Secondary | ICD-10-CM | POA: Diagnosis present

## 2020-01-30 NOTE — Patient Instructions (Signed)
-  Have with your sandwich: carrots or 1/2 cup fruit or 1 whole of fruit  -Try using plain low fat greek yogurt instead of sour cream for your ranch  -keep a log of the fresh foods you have in the fridge on a whiteboard on the fridge   -Snacks: any fruit, raw pepper, raw broccoli, 1/4 cup measured out nuts   -Keep 3 different lists on it with different food options to choose each shopping day   -Aim to have non starchy vegetables 2 times a day 7 days a week  -Aim for breakfast within 1-1.5 hours of waking: do not box yourself in  -Get a water bottle  -Try to be as active as you can throughout the week

## 2020-01-30 NOTE — Progress Notes (Signed)
Assessment:  Primary concerns today: weight control.   Pt states she has prediabetes but does not know the A1C (this is not in epic). Pt states she has a brain tumor currently resulting in prolactin increase and vitamin D defficient. Pt states she does have PCOS. Pt states she does not snack. Pt states she has lost her sweet tooth. Pt does have hx kidney stone. Pt states she is in college with her last semester. Pt admits to being stressed. Pt states she feels iceburg lettuce hurts her stomach. Pt states she rarely goes grocery shopping.    MEDICATIONS: see list   DIETARY INTAKE:  Usual eating pattern includes 2 meals and 1 snacks per day.  Everyday foods include none stated.  Avoided foods include pineapple (allergy).    24-hr recall:  B ( AM): skipped Snk ( AM): L ( 12PM): sandwich with mulligrain [place] and cheese + cheese + chips Snk ( PM): chocolate  D ( PM): eating out Snk ( PM):  Beverages: seltzer water, gingerale, water, cranberry juice   Usual physical activity: ADL's  Estimated energy needs: 1600 calories  Progress Towards Goal(s):  In progress.   Nutritional Diagnosis:  NB-1.1 Food and nutrition-related knowledge deficit As related to no previous dietary education.  As evidenced by 24 hr recall and physcian referral .    Intervention/Education:  Nutrition counseling. Dietitian educated pt on dietary balance within the context of PCOS and weight maintenance. Why physical activity is needed:  Physical activity has various benefits for the body such as reducing stress, helping control blood glucose levels, strengthening the heart, reducing incidence of cognitive impairment, and has a neuroprotective effect on memory function. Research reports that physical activity (whether it be aerobic, resistance training, or high intensity interval training) help control the level of reactive oxygen species which can lead to oxidative stress on the brain therefor possibly impairing  memory, learning abilities, and other cognitive functions. The CDC recognizes that physical activity can help prevent premature death, may prevent the development of type 2 diabetes, different cancers, and heart disease. Not only are there are health benefits to physical activity, the CDC also reports that physical activity would decrease health care costs, increase property values, and people who are physically active generally have less sick days. Resource https://www-sciencedirect-com.libproxy.http://www.castillo-fisher.org/, CDC Importance of vegetables To have an overall healthy diet, adult men and women are recommended to consume anywhere from 2-3 cups of vegetables daily. Vegetables provide a wide range of vitamins and minerals such as vitamin A, vitamin C, potassium, and folic acid. According to the [employer], including fruit and vegetables daily may reduce the risk of cardiovascular disease, certain cancers, and other non-communicable diseases. Why you need complex carbohydrates: Whole grains and other complex carbohydrates are required to have a healthy diet. Whole grains provide fiber which can help with blood glucose levels and help keep you satiated. Fruits and starchy vegetables provide essential vitamins and minerals required for immune function, eyesight support, brain support, bone density, wound healing and many other functions within the body. According to the current evidenced based 2020-2025 Dietary Guidelines for Americans, complex carbohydrates are part of a healthy eating pattern which is associated with a decreased risk for type 2 diabetes, cancers, and cardiovascular disease.  Why should you not wait past 5 hours to eat?: Eating 3-5 hours after your meals will help control blood glucose (sugar) levels and helps control your hunger. Having too large of a gap between meals will cause you to be  very hungry and eat too much; this can make you sick if you have  had bariatric surgery and/or can cause larger fluctuations in your blood glucose levels if you have diabetes. Those who struggle with hypoglycemia (low blood glucose) will experience more hypoglycemic events if they do not have a consistent meal schedule.  ? NCM, SkunkAlert.co.nz,    Goals:  -Have with your sandwich: carrots or 1/2 cup fruit or 1 whole of fruit -Try using plain low fat greek yogurt instead of sour cream for your ranch -keep a log of the fresh foods you have in the fridge on a whiteboard on the fridge  -Snacks: any fruit, raw pepper, raw broccoli, 1/4 cup measured out nuts  -Keep 3 different lists on it with different food options to choose each shopping day  -Aim to have non starchy vegetables 2 times a day 7 days a week -Aim for breakfast within 1-1.5 hours of waking: do not box yourself in -Get a water bottle -Try to be as active as you can throughout the week  Teaching Method Utilized: Visual Auditory Hands on  Handouts given during visit include:  Meal ideas   Barriers to learning/adherence to lifestyle change: none identified   Demonstrated degree of understanding via:  Teach Back   Monitoring/Evaluation:  Dietary intake, exercise, and body weight prn.

## 2020-03-19 ENCOUNTER — Ambulatory Visit (INDEPENDENT_AMBULATORY_CARE_PROVIDER_SITE_OTHER): Payer: BC Managed Care – PPO | Admitting: Family Medicine

## 2020-03-19 ENCOUNTER — Other Ambulatory Visit: Payer: Self-pay

## 2020-03-19 ENCOUNTER — Encounter: Payer: Self-pay | Admitting: Family Medicine

## 2020-03-19 VITALS — BP 122/80 | HR 82 | Temp 97.7°F | Ht 66.0 in | Wt 283.8 lb

## 2020-03-19 DIAGNOSIS — E559 Vitamin D deficiency, unspecified: Secondary | ICD-10-CM

## 2020-03-19 DIAGNOSIS — D352 Benign neoplasm of pituitary gland: Secondary | ICD-10-CM

## 2020-03-19 DIAGNOSIS — E221 Hyperprolactinemia: Secondary | ICD-10-CM

## 2020-03-19 DIAGNOSIS — E118 Type 2 diabetes mellitus with unspecified complications: Secondary | ICD-10-CM

## 2020-03-19 DIAGNOSIS — Z23 Encounter for immunization: Secondary | ICD-10-CM

## 2020-03-19 DIAGNOSIS — R197 Diarrhea, unspecified: Secondary | ICD-10-CM | POA: Insufficient documentation

## 2020-03-19 NOTE — Assessment & Plan Note (Deleted)
Following with endocrinology. Cont cabergoline 0.5 mg BID. Getting MRI next month

## 2020-03-19 NOTE — Assessment & Plan Note (Addendum)
Complicated by obesity. Cont Metformin 500 mg daily. Following with endocrinology. Hgb A1c 7.9. will get records. Has dietician follow-up and is avoiding sugar. Reports f/u with endocrine in November. Appreciate endocrine support will provide care as needed.

## 2020-03-19 NOTE — Assessment & Plan Note (Signed)
Cont Vit D 50000 units weekly per endocrinology.

## 2020-03-19 NOTE — Patient Instructions (Signed)
Diarrhea - continue probiotic - Consider avoiding gluten for 5-10 days to see if that helps too  Call or Mychart in 1 month if not improved and wanting to get labs

## 2020-03-19 NOTE — Progress Notes (Signed)
Subjective:     Shawna Robbins is a 24 y.o. female presenting for Establish Care (no concerns )     HPI   Recently diagnosed with diabetes - placed on metformin - saw a nutritionist before he diagnosis and is going back   Labs with endocrinology - hemoglobin a1c, cbc, prolactin, vit d  Recently has had some upset stomach with many of things she usually eats - lettuce is bothering her - getting diarrhea - cannot eat out anymore due to symptoms - started May 2021 - known lactose intolerance - but getting worse     Review of Systems   Social History   Tobacco Use  Smoking Status Never Smoker  Smokeless Tobacco Never Used        Objective:    BP Readings from Last 3 Encounters:  03/19/20 122/80  03/15/17 (!) 94/51  09/15/16 118/70   Wt Readings from Last 3 Encounters:  03/19/20 283 lb 12 oz (128.7 kg)  01/13/19 240 lb (108.9 kg)  03/15/17 234 lb (106.1 kg)    BP 122/80   Pulse 82   Temp 97.7 F (36.5 C) (Temporal)   Ht 5\' 6"  (1.676 m)   Wt 283 lb 12 oz (128.7 kg)   SpO2 98%   BMI 45.80 kg/m    Physical Exam Constitutional:      General: She is not in acute distress.    Appearance: She is well-developed. She is obese. She is not diaphoretic.  HENT:     Right Ear: External ear normal.     Left Ear: External ear normal.  Eyes:     Conjunctiva/sclera: Conjunctivae normal.  Cardiovascular:     Rate and Rhythm: Normal rate and regular rhythm.     Heart sounds: No murmur heard.   Pulmonary:     Effort: Pulmonary effort is normal. No respiratory distress.     Breath sounds: Normal breath sounds. No wheezing.  Abdominal:     General: Abdomen is flat. Bowel sounds are normal. There is no distension.     Palpations: Abdomen is soft.     Tenderness: There is no abdominal tenderness. There is no guarding or rebound.  Musculoskeletal:     Cervical back: Neck supple.  Skin:    General: Skin is warm and dry.     Capillary Refill: Capillary refill  takes less than 2 seconds.  Neurological:     Mental Status: She is alert. Mental status is at baseline.  Psychiatric:        Mood and Affect: Mood normal.        Behavior: Behavior normal.           Assessment & Plan:   Problem List Items Addressed This Visit      Endocrine   Hyperprolactinemia ([place])   Prolactinoma ([place])    Following with endocrinology. Cont cabergoline 0.5 mg BID. Getting MRI next month will get records.       Controlled diabetes mellitus type 2 with complications ([place])    Complicated by obesity. Cont Metformin 500 mg daily. Following with endocrinology. Hgb A1c 7.9. will get records. Has dietician follow-up and is avoiding sugar. Reports f/u with endocrine in November. Appreciate endocrine support will provide care as needed.        Relevant Medications   metFORMIN (GLUCOPHAGE-XR) 500 MG 24 hr tablet     Other   Vitamin D deficiency    Cont Vit D 50000 units weekly per endocrinology.  Diarrhea    Reports a few months of loose stools and known lactose intolerance. Discussed trial of gluten avoidance and she will continue the probiotic she just started. If no improvement with either we will get labs and stool studies. No blood in stool or weight loss.        Other Visit Diagnoses    Need for influenza vaccination    -  Primary   Relevant Orders   Flu Vaccine QUAD 36+ mos IM (Completed)   Need for meningococcal vaccination       Relevant Orders   MENINGOCOCCAL MCV4O (Completed)       Return in about 1 year (around 03/19/2021) for annual visit.  Lesleigh Noe, MD  This visit occurred during the SARS-CoV-2 public health emergency.  Safety protocols were in place, including screening questions prior to the visit, additional usage of staff PPE, and extensive cleaning of exam room while observing appropriate contact time as indicated for disinfecting solutions.

## 2020-03-19 NOTE — Assessment & Plan Note (Signed)
Reports a few months of loose stools and known lactose intolerance. Discussed trial of gluten avoidance and she will continue the probiotic she just started. If no improvement with either we will get labs and stool studies. No blood in stool or weight loss.

## 2020-03-19 NOTE — Assessment & Plan Note (Addendum)
Following with endocrinology. Cont cabergoline 0.5 mg BID. Getting MRI next month will get records.

## 2020-04-02 ENCOUNTER — Encounter: Payer: Self-pay | Admitting: Family Medicine

## 2022-08-01 DIAGNOSIS — D352 Benign neoplasm of pituitary gland: Secondary | ICD-10-CM | POA: Diagnosis not present

## 2022-08-01 DIAGNOSIS — C716 Malignant neoplasm of cerebellum: Secondary | ICD-10-CM | POA: Diagnosis not present

## 2022-08-01 DIAGNOSIS — E221 Hyperprolactinemia: Secondary | ICD-10-CM | POA: Diagnosis not present

## 2022-09-02 DIAGNOSIS — D443 Neoplasm of uncertain behavior of pituitary gland: Secondary | ICD-10-CM | POA: Diagnosis not present

## 2022-09-02 DIAGNOSIS — N912 Amenorrhea, unspecified: Secondary | ICD-10-CM | POA: Diagnosis not present

## 2022-09-02 DIAGNOSIS — D352 Benign neoplasm of pituitary gland: Secondary | ICD-10-CM | POA: Diagnosis not present

## 2022-09-02 DIAGNOSIS — E119 Type 2 diabetes mellitus without complications: Secondary | ICD-10-CM | POA: Diagnosis not present

## 2022-09-02 DIAGNOSIS — E282 Polycystic ovarian syndrome: Secondary | ICD-10-CM | POA: Diagnosis not present

## 2022-09-02 DIAGNOSIS — C719 Malignant neoplasm of brain, unspecified: Secondary | ICD-10-CM | POA: Diagnosis not present

## 2023-04-13 DIAGNOSIS — E119 Type 2 diabetes mellitus without complications: Secondary | ICD-10-CM | POA: Diagnosis not present

## 2023-04-13 DIAGNOSIS — N912 Amenorrhea, unspecified: Secondary | ICD-10-CM | POA: Diagnosis not present

## 2023-04-13 DIAGNOSIS — D352 Benign neoplasm of pituitary gland: Secondary | ICD-10-CM | POA: Diagnosis not present

## 2023-04-13 DIAGNOSIS — C719 Malignant neoplasm of brain, unspecified: Secondary | ICD-10-CM | POA: Diagnosis not present

## 2023-04-13 DIAGNOSIS — E282 Polycystic ovarian syndrome: Secondary | ICD-10-CM | POA: Diagnosis not present

## 2023-04-13 DIAGNOSIS — D443 Neoplasm of uncertain behavior of pituitary gland: Secondary | ICD-10-CM | POA: Diagnosis not present

## 2023-04-13 LAB — LAB REPORT - SCANNED
A1c: 6.5
EGFR (African American): 103
TSH: 1.85 (ref 0.41–5.90)

## 2023-06-30 ENCOUNTER — Other Ambulatory Visit: Payer: Self-pay | Admitting: Endocrinology

## 2023-06-30 DIAGNOSIS — D443 Neoplasm of uncertain behavior of pituitary gland: Secondary | ICD-10-CM

## 2023-09-18 ENCOUNTER — Encounter: Payer: Self-pay | Admitting: Nurse Practitioner

## 2023-09-18 ENCOUNTER — Other Ambulatory Visit (HOSPITAL_COMMUNITY)
Admission: RE | Admit: 2023-09-18 | Discharge: 2023-09-18 | Disposition: A | Discharge status: Home / Self Care | Source: Ambulatory Visit | Attending: Nurse Practitioner | Admitting: Nurse Practitioner

## 2023-09-18 ENCOUNTER — Ambulatory Visit: Payer: Self-pay | Admitting: Nurse Practitioner

## 2023-09-18 VITALS — BP 120/74 | HR 98 | Temp 98.6°F | Ht 65.5 in | Wt 283.0 lb

## 2023-09-18 DIAGNOSIS — Z113 Encounter for screening for infections with a predominantly sexual mode of transmission: Secondary | ICD-10-CM | POA: Diagnosis present

## 2023-09-18 DIAGNOSIS — Z0000 Encounter for general adult medical examination without abnormal findings: Secondary | ICD-10-CM | POA: Diagnosis not present

## 2023-09-18 DIAGNOSIS — Z6841 Body Mass Index (BMI) 40.0 and over, adult: Secondary | ICD-10-CM | POA: Diagnosis not present

## 2023-09-18 DIAGNOSIS — Z1329 Encounter for screening for other suspected endocrine disorder: Secondary | ICD-10-CM

## 2023-09-18 DIAGNOSIS — Z13220 Encounter for screening for lipoid disorders: Secondary | ICD-10-CM | POA: Diagnosis not present

## 2023-09-18 DIAGNOSIS — E559 Vitamin D deficiency, unspecified: Secondary | ICD-10-CM | POA: Diagnosis not present

## 2023-09-18 DIAGNOSIS — R7303 Prediabetes: Secondary | ICD-10-CM | POA: Insufficient documentation

## 2023-09-18 DIAGNOSIS — Z1159 Encounter for screening for other viral diseases: Secondary | ICD-10-CM

## 2023-09-18 DIAGNOSIS — Z114 Encounter for screening for human immunodeficiency virus [HIV]: Secondary | ICD-10-CM | POA: Insufficient documentation

## 2023-09-18 DIAGNOSIS — D352 Benign neoplasm of pituitary gland: Secondary | ICD-10-CM

## 2023-09-18 LAB — COMPREHENSIVE METABOLIC PANEL WITH GFR
ALT: 25 U/L (ref 0–35)
AST: 16 U/L (ref 0–37)
Albumin: 4.8 g/dL (ref 3.5–5.2)
Alkaline Phosphatase: 41 U/L (ref 39–117)
BUN: 9 mg/dL (ref 6–23)
CO2: 28 meq/L (ref 19–32)
Calcium: 9.6 mg/dL (ref 8.4–10.5)
Chloride: 104 meq/L (ref 96–112)
Creatinine, Ser: 0.92 mg/dL (ref 0.40–1.20)
GFR: 85.52 mL/min (ref >60.00)
Glucose, Bld: 108 mg/dL — ABNORMAL HIGH (ref 70–99)
Potassium: 4.4 meq/L (ref 3.5–5.1)
Sodium: 139 meq/L (ref 135–145)
Total Bilirubin: 0.5 mg/dL (ref 0.2–1.2)
Total Protein: 7.3 g/dL (ref 6.0–8.3)
eGFR: 103

## 2023-09-18 LAB — BASIC METABOLIC PANEL WITH GFR
BUN: 10 (ref 4–21)
Creatinine: 0.8 (ref 0.5–1.1)
Glucose: 75
Sodium: 139 (ref 137–147)

## 2023-09-18 LAB — CBC
HCT: 44.2 % (ref 36.0–46.0)
Hemoglobin: 14.1 g/dL (ref 12.0–15.0)
MCHC: 32 g/dL (ref 30.0–36.0)
MCV: 83.8 fl (ref 78.0–100.0)
Platelets: 343 10*3/uL (ref 150.0–400.0)
RBC: 5.27 Mil/uL — ABNORMAL HIGH (ref 3.87–5.11)
RDW: 13.8 % (ref 11.5–15.5)
WBC: 4.4 10*3/uL (ref 4.0–10.5)

## 2023-09-18 LAB — LIPID PANEL
Cholesterol: 232 mg/dL — ABNORMAL HIGH (ref 0–200)
HDL: 50.6 mg/dL (ref >39.00)
LDL Cholesterol: 158 mg/dL — ABNORMAL HIGH (ref 0–99)
NonHDL: 181.62
Total CHOL/HDL Ratio: 5
Triglycerides: 119 mg/dL (ref 0.0–149.0)
VLDL: 23.8 mg/dL (ref 0.0–40.0)

## 2023-09-18 LAB — TSH: TSH: 2.02 u[IU]/mL (ref 0.35–5.50)

## 2023-09-18 LAB — VITAMIN D 25 HYDROXY (VIT D DEFICIENCY, FRACTURES): VITD: <7 ng/mL — ABNORMAL LOW (ref 30.00–100.00)

## 2023-09-18 LAB — HEMOGLOBIN A1C: Hemoglobin A1C: 6.5

## 2023-09-18 NOTE — Patient Instructions (Signed)
 Nice to see you today I will be in touch with the labs once I have them Follow up with me in 1 year, sooner if you need me

## 2023-09-18 NOTE — Assessment & Plan Note (Addendum)
 She is followed by Dr. Horald Pollen at [facility] and she is maintained on Trulicity 1.5 mg once weekly injections. No changes to plan at this time.   I evaluated patient, was consulted regarding treatment, and agree with assessment and plan per Denice Bors RN, FNP Student   Audria Nine, DNP, AGNP-C

## 2023-09-18 NOTE — Assessment & Plan Note (Addendum)
 Currently taking 5,000 international units vitamin D OTC supplement. Labs pending.    I evaluated patient, was consulted regarding treatment, and agree with assessment and plan per Denice Bors RN, FNP Student   Audria Nine, DNP, AGNP-C

## 2023-09-18 NOTE — Progress Notes (Signed)
 New Patient Office Visit  Subjective    Patient ID: Shawna Robbins, female    DOB: 07-Apr-1996  Age: 28 y.o. MRN: [id number]  CC:  Chief Complaint  Patient presents with   Transitions Of Care    Seen by Dr. Selena Batten in past     HPI Shawna Robbins presents to establish care   Pre-diabetes: followed by endocrine and on truliciyt 1.5 and does have supplies to check sugar. She will get jittery without eating. She is followed every 4 months  Pituatary tumor: she is followed by [facility] s/p resection of bengin tumor. She is followed by Dr balan with endocrine and on bromocriptine.  for complete physical and follow up of chronic conditions.  Immunizations: -Tetanus: Completed in within 10  -Influenza:  -Shingles: too young -Pneumonia: too young -covid: original and booster   Diet: Fair diet. 2-3 meals a day and she will snack (cracker and chips). She will drink water, seltzer water and hot tea Exercise:  Just atarted walking 3 times a week for 90 mins   Eye exam: Completes annually. Wears glasses. Was followed by [facility]  Dental exam: Completes semi-annually    Colonoscopy: too young  Lung Cancer Screening: NA   Pap smear: 2020, she is suppose to go next month. She sees Dr [facility]ilt [facility]   Sleep: Goes to bed around 9 and gets up around 530 and she does feel rested. Does not snore     Outpatient Encounter Medications as of 09/18/2023  Medication Sig   ACCU-CHEK GUIDE test strip    Accu-Chek Softclix Lancets lancets SMARTSIG:Topical   bromocriptine (PARLODEL) 5 MG capsule Take 5 mg by mouth daily.   medroxyPROGESTERone (PROVERA) 10 MG tablet    TRULICITY 1.5 MG/0.5ML SOAJ Inject 1.5 mg into the skin once a week.   Vitamin D, Ergocalciferol, (DRISDOL) 1.25 MG (50000 UNIT) CAPS capsule Take 50,000 Units by mouth once a week. (Patient not taking: Reported on 09/18/2023)   [DISCONTINUED] cabergoline (DOSTINEX) 0.5 MG tablet Take 0.5 mg by mouth 2  (two) times a week. 1 and 1/2 tablets taken   [DISCONTINUED] metFORMIN (GLUCOPHAGE-XR) 500 MG 24 hr tablet Take 500 mg by mouth daily.   No facility-administered encounter medications on file as of 09/18/2023.    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   Prediabetes     Past Surgical History:  Procedure Laterality Date   BRAIN SURGERY  2011    Family History  Problem Relation Age of Onset   Diabetes Mother    Diabetes Maternal Aunt    Hypertension Maternal Grandmother    Diabetes Maternal Grandmother    Hypertension Paternal Grandmother     Social History   Socioeconomic History   Marital status: Single    Spouse name: Not on file   Number of children: Not on file   Years of education: college   Highest education level: Not on file  Occupational History   Not on file  Tobacco Use   Smoking status: Never   Smokeless tobacco: Never  Vaping Use   Vaping status: Never Used  Substance and Sexual Activity   Alcohol use: No   Drug use: No   Sexual activity: Not Currently  Other Topics Concern   Not on file  Social History Narrative  03/19/20   From: the area   Living: with mom and step father   Work: Corporate treasurer at [employer]      Family: mom, brother - Apolinar Junes       Enjoys: watch netflix      Exercise: walking - 3 times a week - 30-45 minutes   Diet: generally healthy, reducing sugar and trying to follow diabetic diet      Safety   Seat belts: Yes    Guns: Yes  and secure   Safe in relationships: Yes    Social Drivers of Corporate investment banker Strain: Not on file  Food Insecurity: Not on file  Transportation Needs: Not on file  Physical Activity: Not on file  Stress: Not on file  Social Connections: Not on file  Intimate Partner Violence: Not on file    Review of Systems  Constitutional:  Negative for  chills and fever.  Respiratory:  Negative for shortness of breath.   Cardiovascular:  Negative for chest pain and leg swelling.  Gastrointestinal:  Positive for nausea. Negative for abdominal pain, blood in stool, constipation, diarrhea and vomiting.       BM daily   Genitourinary:  Negative for dysuria and hematuria.  Neurological:  Positive for headaches. Negative for tingling.  Psychiatric/Behavioral:  Negative for hallucinations and suicidal ideas.         Objective    BP 120/74   Pulse 98   Temp 98.6 F (37 C) (Oral)   Ht 5' 5.5" (1.664 m)   Wt 283 lb (128.4 kg)   SpO2 98%   BMI 46.38 kg/m   Physical Exam Vitals and nursing note reviewed.  Constitutional:      Appearance: Normal appearance.  HENT:     Right Ear: Tympanic membrane, ear canal and external ear normal.     Left Ear: Tympanic membrane, ear canal and external ear normal.     Mouth/Throat:     Mouth: Mucous membranes are moist.     Pharynx: Oropharynx is clear.  Eyes:     Extraocular Movements: Extraocular movements intact.     Pupils: Pupils are equal, round, and reactive to light.  Cardiovascular:     Rate and Rhythm: Normal rate and regular rhythm.     Pulses: Normal pulses.     Heart sounds: Normal heart sounds.  Pulmonary:     Effort: Pulmonary effort is normal.     Breath sounds: Normal breath sounds.  Abdominal:     General: Bowel sounds are normal. There is no distension.     Palpations: There is no mass.     Tenderness: There is no abdominal tenderness.     Hernia: No hernia is present.  Musculoskeletal:     Right lower leg: No edema.     Left lower leg: No edema.  Lymphadenopathy:     Cervical: No cervical adenopathy.  Skin:    General: Skin is warm.  Neurological:     General: No focal deficit present.     Mental Status: She is alert.     Deep Tendon Reflexes:     Reflex Scores:      Bicep reflexes are 2+ on the right side and 2+ on the left side.      Patellar reflexes are 2+  on the right side and 2+ on the left side.    Comments: Bilateral upper and lower extremity strength 5/5  Psychiatric:        Mood and Affect:  Mood normal.        Behavior: Behavior normal.        Thought Content: Thought content normal.        Judgment: Judgment normal.         Assessment & Plan:   Problem List Items Addressed This Visit       Endocrine   Prolactinoma (HCC)   History of same.  Patient currently maintained on bromocriptine and follow-up with neurosurgery and endocrinology.  Continue taking medication as prescribed follow specialist as recommended.  Patient states they are talking about the resection but have not had this planned yet  I evaluated patient, was consulted regarding treatment, and agree with assessment and plan per Denice Bors RN, FNP Student   Audria Nine, DNP, AGNP-C         Other   Vitamin D deficiency   Currently taking 5,000 international units vitamin D OTC supplement. Labs pending.    I evaluated patient, was consulted regarding treatment, and agree with assessment and plan per Denice Bors RN, FNP Student   Audria Nine, DNP, AGNP-C       Relevant Orders   VITAMIN D 25 Hydroxy (Vit-D Deficiency, Fractures) (Completed)   Encounter for screening for HIV - Primary   Relevant Orders   HIV antibody (with reflex)   Encounter for hepatitis C screening test for low risk patient   Relevant Orders   Hepatitis C Antibody   Screening examination for STI   Relevant Orders   RPR   Urine cytology ancillary only   Well adult exam   Discussed age-appropriate immunizations and screening exams.  Did review patient's personal, surgical, social, family histories.  Patient is up-to-date on all age-appropriate vaccinations she would like.  She receives cervical CA screenings by Dr. Su Hilt at [facility]. Will perform screenings for lipids, thyroid, STI, HIV, and Hep C. Labs pending.   Patient was given information at discharge about  preventative healthcare maintenance with anticipatory guidance     I evaluated patient, was consulted regarding treatment, and agree with assessment and plan per Denice Bors RN, FNP Student   Audria Nine, DNP, AGNP-C       Relevant Orders   CBC (Completed)   Comprehensive metabolic panel with GFR (Completed)   TSH (Completed)   Lipid panel (Completed)   Morbid obesity (HCC)   Educated on diet and lifestyle modifications. Labs pending  I evaluated patient, was consulted regarding treatment, and agree with assessment and plan per Denice Bors RN, FNP Student   Audria Nine, DNP, AGNP-C       Relevant Medications   TRULICITY 1.5 MG/0.5ML SOAJ   Other Relevant Orders   CBC (Completed)   Comprehensive metabolic panel with GFR (Completed)   TSH (Completed)   Lipid panel (Completed)   Screening, lipid   Relevant Orders   Lipid panel (Completed)   Screening for thyroid disorder   Relevant Orders   TSH (Completed)   Prediabetes   She is followed by Dr. Horald Pollen at [facility] and she is maintained on Trulicity 1.5 mg once weekly injections. No changes to plan at this time.   I evaluated patient, was consulted regarding treatment, and agree with assessment and plan per Denice Bors RN, FNP Student   Audria Nine, DNP, AGNP-C        Return in about 1 year (around 09/17/2024) for CPE and Labs.   Audria Nine, NP

## 2023-09-18 NOTE — Progress Notes (Signed)
 New Patient Office Visit  Subjective    Patient ID: Shawna Robbins, female    DOB: Oct 09, 1995  Age: 28 y.o. MRN: [id number]  CC:  Chief Complaint  Patient presents with   Transitions Of Care    Seen by Dr. Selena Robbins in past     HPI Shawna Robbins presents to establish care  for complete physical and follow up of chronic conditions.  She is a 83 YOF with a hx of resected pilocytic astrocytoma, microadenoma of the pituitary, and DM2.   Prediabetes: She is followed by Dr Shawna Robbins and is maintained on Trulicity 1.5 mg weekly. She does not check glucose at home regularly, but does have a glucometer. She will sometimes feel jitters if she doesn't eat well.   Pituitary: She is followed by Dr. Samson Robbins with neurosurgery for the prolactin secreting microadenoma. She is maintained on bromocriptine 5 mg. She reports occasional nausea from the medication. She has been in discussions with Dr. Samson Robbins about potential resection of the tumor in the future.   Immunizations: -Tetanus: Completed in 2021 -Influenza: Receives annually.  -COVID: Completed 2 series and booster. -Shingles: Not indicated. -Pneumonia: Not indicated.   Diet: Fair diet. Eats 2-3 meals a day. Snacks crackers and chips. Drinks water and hot tea. 64 oz of water a day. Exercise: No regular exercise. Walks 3 times a week for 1.5 hours.  Sleep: Goes to bed at 9 and wakes up at 5:30. Wakes up feeling rested.  Eye exam: Completes annually with [facility]. Wears Rx glasses for driving.  Dental exam: Completes semi-annually.  Colonoscopy: Not indicated, low risk Lung Cancer Screening: Not indicated, low risk  Mammo: Not indicated. PAP: Scheduled to go next month. Gyn is Dr. Su Robbins at [facility].      Outpatient Encounter Medications as of 09/18/2023  Medication Sig   ACCU-CHEK GUIDE test strip    Accu-Chek Softclix Lancets lancets SMARTSIG:Topical   bromocriptine (PARLODEL) 5 MG capsule Take 5 mg by mouth daily.    medroxyPROGESTERone (PROVERA) 10 MG tablet    TRULICITY 1.5 MG/0.5ML SOAJ Inject 1.5 mg into the skin once a week.   Vitamin D, Ergocalciferol, (DRISDOL) 1.25 MG (50000 UNIT) CAPS capsule Take 50,000 Units by mouth once a week. (Patient not taking: Reported on 09/18/2023)   [DISCONTINUED] cabergoline (DOSTINEX) 0.5 MG tablet Take 0.5 mg by mouth 2 (two) times a week. 1 and 1/2 tablets taken   [DISCONTINUED] metFORMIN (GLUCOPHAGE-XR) 500 MG 24 hr tablet Take 500 mg by mouth daily.   No facility-administered encounter medications on file as of 09/18/2023.    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   Prediabetes     Past Surgical History:  Procedure Laterality Date   BRAIN SURGERY  2011    Family History  Problem Relation Age of Onset   Diabetes Mother    Diabetes Maternal Aunt    Hypertension Maternal Grandmother    Diabetes Maternal Grandmother    Hypertension Paternal Grandmother     Social History   Socioeconomic History   Marital status: Single    Spouse name: Not on file   Number of children: Not on file   Years of education: college   Highest education level: Not on file  Occupational History   Not on file  Tobacco Use   Smoking status: Never  Smokeless tobacco: Never  Vaping Use   Vaping status: Never Used  Substance and Sexual Activity   Alcohol use: No   Drug use: No   Sexual activity: Not Currently  Other Topics Concern   Not on file  Social History Narrative   03/19/20   From: the area   Living: with mom and step father   Work: Corporate treasurer at [employer]      Family: mom, brother - Shawna Robbins       Enjoys: watch netflix      Exercise: walking - 3 times a week - 30-45 minutes   Diet: generally healthy, reducing sugar and trying to follow diabetic diet      Safety   Seat belts: Yes    Guns: Yes  and secure   Safe  in relationships: Yes    Social Drivers of Corporate investment banker Strain: Not on file  Food Insecurity: Not on file  Transportation Needs: Not on file  Physical Activity: Not on file  Stress: Not on file  Social Connections: Not on file  Intimate Partner Violence: Not on file    Review of Systems  Constitutional:  Negative for chills, fever and weight loss.  Respiratory:  Negative for shortness of breath.   Cardiovascular:  Negative for chest pain and palpitations.  Gastrointestinal:  Positive for nausea. Negative for abdominal pain and vomiting.       BM daily  Musculoskeletal:  Negative for joint pain and myalgias.  Neurological:  Positive for headaches. Negative for dizziness.  Psychiatric/Behavioral:  Negative for depression and suicidal ideas.         Objective    BP 120/74   Pulse 98   Temp 98.6 F (37 C) (Oral)   Ht 5' 5.5" (1.664 m)   Wt 128.4 kg   SpO2 98%   BMI 46.38 kg/m   Physical Exam Constitutional:      Appearance: Normal appearance.  HENT:     Right Ear: Tympanic membrane, ear canal and external ear normal.     Left Ear: Tympanic membrane, ear canal and external ear normal.     Mouth/Throat:     Mouth: Mucous membranes are moist.     Pharynx: Oropharynx is clear.  Eyes:     Extraocular Movements: Extraocular movements intact.     Pupils: Pupils are equal, round, and reactive to light.     Comments: Pupils 5mm bilat  Cardiovascular:     Rate and Rhythm: Normal rate and regular rhythm.     Heart sounds: Normal heart sounds. No murmur heard.    No friction rub. No gallop.  Pulmonary:     Effort: Pulmonary effort is normal.     Breath sounds: Normal breath sounds.  Abdominal:     General: Bowel sounds are normal.     Palpations: Abdomen is soft.  Musculoskeletal:     Cervical back: Neck supple.  Lymphadenopathy:     Cervical: No cervical adenopathy.  Neurological:     Mental Status: She is alert and oriented to person, place, and  time.     Deep Tendon Reflexes:     Reflex Scores:      Bicep reflexes are 1+ on the right side and 1+ on the left side.      Patellar reflexes are 1+ on the right side and 1+ on the left side.    Comments: Strength to bilateral upper and lower extremities 5/5  Psychiatric:  Mood and Affect: Mood normal.        Behavior: Behavior normal.         Assessment & Plan:   Problem List Items Addressed This Visit     Vitamin D deficiency   Currently taking 5,000 international units vitamin D OTC supplement. Labs pending.        Relevant Orders   VITAMIN D 25 Hydroxy (Vit-D Deficiency, Fractures)   Encounter for screening for HIV - Primary   Relevant Orders   HIV antibody (with reflex)   Encounter for hepatitis C screening test for low risk patient   Relevant Orders   Hepatitis C Antibody   Screening examination for STI   Relevant Orders   RPR   Urine cytology ancillary only   Well adult exam   Discussed age-appropriate immunizations and screening exams.  Did review patient's personal, surgical, social, family histories.  Patient is up-to-date on all age-appropriate vaccinations she would like.  She receives cervical CA screenings by Dr. Su Robbins at [facility]. Will perform screenings for lipids, thyroid, STI, HIV, and Hep C. Labs pending.   Patient was given information at discharge about preventative healthcare maintenance with anticipatory guidance           Relevant Orders   CBC   Comprehensive metabolic panel with GFR   TSH   Lipid panel   Morbid obesity (HCC)   Educated on diet and lifestyle modifications. Labs pending      Relevant Medications   TRULICITY 1.5 MG/0.5ML SOAJ   Other Relevant Orders   CBC   Comprehensive metabolic panel with GFR   TSH   Lipid panel   Screening, lipid   Relevant Orders   Lipid panel   Screening for thyroid disorder   Relevant Orders   TSH   Prediabetes   She is followed by Dr. Horald Robbins at [facility] and she is maintained on Trulicity 1.5 mg once weekly injections. No changes to plan at this time.        Return in about 1 year (around 09/17/2024) for CPE and Labs.   Murvin Donning, RN

## 2023-09-18 NOTE — Assessment & Plan Note (Addendum)
 Discussed age-appropriate immunizations and screening exams.  Did review patient's personal, surgical, social, family histories.  Patient is up-to-date on all age-appropriate vaccinations she would like.  She receives cervical CA screenings by Dr. Su Hilt at [facility]. Will perform screenings for lipids, thyroid, STI, HIV, and Hep C. Labs pending.   Patient was given information at discharge about preventative healthcare maintenance with anticipatory guidance     I evaluated patient, was consulted regarding treatment, and agree with assessment and plan per Denice Bors RN, FNP Student   Audria Nine, DNP, AGNP-C

## 2023-09-18 NOTE — Assessment & Plan Note (Addendum)
 Educated on diet and lifestyle modifications. Labs pending  I evaluated patient, was consulted regarding treatment, and agree with assessment and plan per Denice Bors RN, FNP Student   Audria Nine, DNP, AGNP-C

## 2023-09-18 NOTE — Assessment & Plan Note (Signed)
 History of same.  Patient currently maintained on bromocriptine and follow-up with neurosurgery and endocrinology.  Continue taking medication as prescribed follow specialist as recommended.  Patient states they are talking about the resection but have not had this planned yet  I evaluated patient, was consulted regarding treatment, and agree with assessment and plan per Denice Bors RN, FNP Student   Audria Nine, DNP, AGNP-C

## 2023-09-19 LAB — HEPATITIS C ANTIBODY: Hepatitis C Ab: NONREACTIVE

## 2023-09-19 LAB — RPR: RPR Ser Ql: NONREACTIVE

## 2023-09-19 LAB — HIV ANTIBODY (ROUTINE TESTING W REFLEX): HIV 1&2 Ab, 4th Generation: NONREACTIVE

## 2023-09-22 ENCOUNTER — Other Ambulatory Visit: Payer: Self-pay | Admitting: Nurse Practitioner

## 2023-09-22 DIAGNOSIS — E7800 Pure hypercholesterolemia, unspecified: Secondary | ICD-10-CM

## 2023-09-23 LAB — URINE CYTOLOGY ANCILLARY ONLY
Chlamydia: NEGATIVE
Comment: NEGATIVE
Comment: NEGATIVE
Comment: NORMAL
Neisseria Gonorrhea: NEGATIVE
Trichomonas: NEGATIVE

## 2023-09-29 ENCOUNTER — Telehealth: Payer: Self-pay | Admitting: Nurse Practitioner

## 2023-09-29 NOTE — Telephone Encounter (Signed)
 Received medical records via Mail placed in providers box for review at front desk.

## 2023-09-30 NOTE — Telephone Encounter (Signed)
 Medical records have been placed in pcp box in the back for review.

## 2023-11-13 ENCOUNTER — Encounter: Payer: Self-pay | Admitting: Nurse Practitioner

## 2023-11-19 ENCOUNTER — Encounter: Payer: Self-pay | Admitting: Endocrinology

## 2024-09-19 ENCOUNTER — Encounter: Admitting: Nurse Practitioner
